# Patient Record
Sex: Male | Born: 1945 | Race: White | Hispanic: No | Marital: Married | Smoking: Former smoker
Health system: Southern US, Community
[De-identification: ages and names within clinical notes are randomized; demographics above are authoritative.]

## PROBLEM LIST (undated history)

## (undated) DIAGNOSIS — C61 Malignant neoplasm of prostate: Secondary | ICD-10-CM

## (undated) DIAGNOSIS — Z77090 Contact with and (suspected) exposure to asbestos: Secondary | ICD-10-CM

## (undated) HISTORY — DX: Malignant neoplasm of prostate: C61

## (undated) HISTORY — DX: Contact with and (suspected) exposure to asbestos: Z77.090

## (undated) HISTORY — PX: LIVER BIOPSY: SHX301

## (undated) HISTORY — PX: HERNIA REPAIR: SHX51

## (undated) HISTORY — PX: HEMORRHOID SURGERY: SHX153

---

## 2010-05-22 HISTORY — PX: ROTATOR CUFF REPAIR: SHX139

## 2010-07-12 ENCOUNTER — Other Ambulatory Visit: Payer: Self-pay | Admitting: Orthopedic Surgery

## 2010-07-12 ENCOUNTER — Encounter (HOSPITAL_COMMUNITY)
Admission: RE | Admit: 2010-07-12 | Discharge: 2010-07-12 | Disposition: A | Discharge status: Home / Self Care | Payer: Worker's Compensation | Source: Ambulatory Visit | Attending: Orthopedic Surgery | Admitting: Orthopedic Surgery

## 2010-07-12 ENCOUNTER — Ambulatory Visit (HOSPITAL_COMMUNITY)
Admission: RE | Admit: 2010-07-12 | Discharge: 2010-07-12 | Disposition: A | Discharge status: Home / Self Care | Payer: Worker's Compensation | Source: Ambulatory Visit | Attending: Orthopedic Surgery | Admitting: Orthopedic Surgery

## 2010-07-12 DIAGNOSIS — Z01811 Encounter for preprocedural respiratory examination: Secondary | ICD-10-CM

## 2010-07-12 DIAGNOSIS — Z01818 Encounter for other preprocedural examination: Secondary | ICD-10-CM | POA: Insufficient documentation

## 2010-07-12 DIAGNOSIS — Z01812 Encounter for preprocedural laboratory examination: Secondary | ICD-10-CM | POA: Insufficient documentation

## 2010-07-12 LAB — CBC
HCT: 46.4 % (ref 39.0–52.0)
Hemoglobin: 16.3 g/dL (ref 13.0–17.0)
MCHC: 35.1 g/dL (ref 30.0–36.0)
RBC: 5.42 MIL/uL (ref 4.22–5.81)

## 2010-07-12 LAB — URINALYSIS, ROUTINE W REFLEX MICROSCOPIC
Nitrite: NEGATIVE
Protein, ur: NEGATIVE mg/dL
Specific Gravity, Urine: 1.017 (ref 1.005–1.030)
Urobilinogen, UA: 1 mg/dL (ref 0.0–1.0)

## 2010-07-12 LAB — PROTIME-INR
INR: 0.92 (ref 0.00–1.49)
Prothrombin Time: 12.6 seconds (ref 11.6–15.2)

## 2010-07-12 LAB — BASIC METABOLIC PANEL
CO2: 28 mEq/L (ref 19–32)
Calcium: 9.8 mg/dL (ref 8.4–10.5)
Creatinine, Ser: 1.36 mg/dL (ref 0.4–1.5)
GFR calc Af Amer: >60 mL/min (ref >60)
GFR calc non Af Amer: 53 mL/min — ABNORMAL LOW (ref >60)
Sodium: 135 mEq/L (ref 135–145)

## 2010-07-12 LAB — SURGICAL PCR SCREEN
MRSA, PCR: NEGATIVE
Staphylococcus aureus: NEGATIVE

## 2010-07-12 LAB — APTT: aPTT: 26 seconds (ref 24–37)

## 2010-07-19 ENCOUNTER — Inpatient Hospital Stay (HOSPITAL_COMMUNITY)
Admission: RE | Admit: 2010-07-19 | Discharge: 2010-07-20 | DRG: 512 | Disposition: A | Discharge status: Home Health | Payer: Worker's Compensation | Source: Ambulatory Visit | Attending: Orthopedic Surgery | Admitting: Orthopedic Surgery

## 2010-07-19 DIAGNOSIS — Y990 Civilian activity done for income or pay: Secondary | ICD-10-CM

## 2010-07-19 DIAGNOSIS — K219 Gastro-esophageal reflux disease without esophagitis: Secondary | ICD-10-CM | POA: Diagnosis present

## 2010-07-19 DIAGNOSIS — I10 Essential (primary) hypertension: Secondary | ICD-10-CM | POA: Diagnosis present

## 2010-07-19 DIAGNOSIS — S43429A Sprain of unspecified rotator cuff capsule, initial encounter: Principal | ICD-10-CM | POA: Diagnosis present

## 2010-07-19 DIAGNOSIS — X58XXXA Exposure to other specified factors, initial encounter: Secondary | ICD-10-CM | POA: Diagnosis present

## 2010-07-20 NOTE — Op Note (Signed)
NAME:  Edward Gilbert, Edward Gilbert               ACCOUNT NO.:  [id number]  MEDICAL RECORD NO.:  [id number]           PATIENT TYPE:  I  LOCATION:  5007                         FACILITY:  [facility]  PHYSICIAN:  Burnard Bunting, M.D.    DATE OF BIRTH:  12/31/1945  DATE OF PROCEDURE:  07/19/2010 DATE OF DISCHARGE:                              OPERATIVE REPORT   PREOPERATIVE DIAGNOSIS:  Right shoulder rotator cuff tear and biceps tendinopathy.  POSTOPERATIVE DIAGNOSIS:  Right shoulder rotator cuff tear and biceps tendinopathy.  PROCEDURE:  Right shoulder diagnostic arthroscopy with extensive debridement of the labrum and torn and degenerated rotator cuff, particularly the infraspinatus with subsequent open biceps tenodesis and open partial rotator cuff tendon repair of the supraspinatus, infraspinatus was not repairable.  INDICATIONS:  Neizan Debruhl is a 65 year old patient with right shoulder pain following a work injury.  MRI scan was consistent with tear of the infraspinatus and supraspinatus with retraction to the glenoid rim. Interestingly, there was no entry in the muscle belly.  The patient presents now for operative management after explanation of risks and benefits.  PROCEDURE IN DETAIL:  The patient was brought to operating room where general endotracheal anesthesia was induced.  Preoperative antibiotics were administered.  The patient was placed in the beach-chair position with the head in neutral position.  Right shoulder, arm, and hand was prescribed with alcohol and Betadine which was allowed to air dry and then prepped with DuraPrep solution, draped in a sterile manner.  Collier Flowers was used to cover the axilla.  Time-out was called.  Posterior portal was created 2 cm medial and inferior to the posterolateral margin of the acromion.  Diagnostic arthroscopy was performed.  Anterior portal was created under direct visualization.  The patient did have some synovitis within the rotator  interval.  This was debrided with the ArthroCare wand.  The biceps tendon had significant adenopathy, greater than 50% tearing, it was released with the ArthroCare wand.  Extensive debridement of the labrum was then performed.  The glenohumeral articular surfaces were intact.  Lateral portal was created and an attempt at mobilization was made.  Using both arthroscopic periosteal elevator, dissection was performed on the superior aspect of the glenoid and on the superior aspect of the rotator cuff in order to mobilize the tendon under traction as it was being held with a grasper.  Tendon would not mobilize, would only mobilize within 2 cm of the medial edge of the footprint.  This was the case for the infraspinatus.  The anterior supraspinatus was able to be mobilized to the tuberosity on the lateral aspect of the bicipital groove.  There was a partial subscap tendinopathy as well.  At this time, the instruments were removed from their portals.  Anterior and posterior portal were closed using 3-0 nylon and Ioban was then used to cover the shoulder.  An incision was made in the midportion of the deltoid.  Skin and subcutaneous tissue were sharply divided.  About 8 mm of the deltoid was detached posteriorly in order to facilitate visualization.  Again, another attempt was made to mobilize the infraspinatus under direct visualization,  but it was not to be.  The tendon was too retracted and too immobile.  The anterior supraspinatus has a sleeve with the subscap was detached and it could be repaired.  At this time, the biceps tendon was identified and the transverse humeral ligament was opened on its medial side.  Biceps tenodesis was then performed with a 7 x 23 bioabsorbable tenodesis screw.  In general, the tendon itself was significantly frayed even down to the portion where it was tenodesed. About a centimeter was resected in order to facilitate optimal tension. At this time, after  tenodesing it with interference screw after placing #2 FiberLoop in place, the rotator cuff was repaired anteriorly.  Using two 5.5 corkscrews and two 4.5 PushLock, a reasonable repair was achieved.  No rotator cuff flaps were remaining.  At this time, joint was thoroughly irrigated.  Instruments were removed.  There was a #1 Vicryl suture which was placed proximally 5 cm from the lateral edge of the acromion to prevent further splitting of the deltoid.  That suture was removed and the deltoid split was repaired using #1 Vicryl suture, one suture through the acromion was placed #2 FiberWire in the inferior deltoid and superior deltoid fascia from that far of the deltoid which was elevated off the acromion was then repaired.  Good secure repair was achieved.  Incision was then closed using interrupted inverted 2-0 Vicryl and running 3-0 pullout Prolene.  The patient tolerated the procedure well without immediate complications.     Burnard Bunting, M.D.     GSD/MEDQ  D:  07/19/2010  T:  07/20/2010  Job:  [id number]  Electronically Signed by Reece Agar.  DEAN M.D. on 07/20/2010 08:43:30 AM

## 2021-09-06 DIAGNOSIS — C220 Liver cell carcinoma: Secondary | ICD-10-CM | POA: Insufficient documentation

## 2021-09-07 ENCOUNTER — Inpatient Hospital Stay (HOSPITAL_COMMUNITY): Payer: No Typology Code available for payment source

## 2021-09-07 ENCOUNTER — Encounter (HOSPITAL_COMMUNITY): Payer: Self-pay | Admitting: Hematology

## 2021-09-07 ENCOUNTER — Inpatient Hospital Stay (HOSPITAL_COMMUNITY): Payer: No Typology Code available for payment source | Attending: Hematology | Admitting: Hematology

## 2021-09-07 DIAGNOSIS — Z8546 Personal history of malignant neoplasm of prostate: Secondary | ICD-10-CM | POA: Diagnosis not present

## 2021-09-07 DIAGNOSIS — M19042 Primary osteoarthritis, left hand: Secondary | ICD-10-CM | POA: Diagnosis not present

## 2021-09-07 DIAGNOSIS — Z800 Family history of malignant neoplasm of digestive organs: Secondary | ICD-10-CM | POA: Insufficient documentation

## 2021-09-07 DIAGNOSIS — Z803 Family history of malignant neoplasm of breast: Secondary | ICD-10-CM | POA: Diagnosis not present

## 2021-09-07 DIAGNOSIS — R634 Abnormal weight loss: Secondary | ICD-10-CM | POA: Diagnosis not present

## 2021-09-07 DIAGNOSIS — Z8673 Personal history of transient ischemic attack (TIA), and cerebral infarction without residual deficits: Secondary | ICD-10-CM | POA: Insufficient documentation

## 2021-09-07 DIAGNOSIS — Z7982 Long term (current) use of aspirin: Secondary | ICD-10-CM | POA: Diagnosis not present

## 2021-09-07 DIAGNOSIS — C220 Liver cell carcinoma: Secondary | ICD-10-CM | POA: Insufficient documentation

## 2021-09-07 DIAGNOSIS — Z87891 Personal history of nicotine dependence: Secondary | ICD-10-CM | POA: Diagnosis not present

## 2021-09-07 LAB — CBC WITH DIFFERENTIAL/PLATELET
Abs Immature Granulocytes: 0.01 10*3/uL (ref 0.00–0.07)
Basophils Absolute: 0.1 10*3/uL (ref 0.0–0.1)
Basophils Relative: 1 %
Eosinophils Absolute: 0.4 10*3/uL (ref 0.0–0.5)
Eosinophils Relative: 7 %
HCT: 41 % (ref 39.0–52.0)
Hemoglobin: 14.3 g/dL (ref 13.0–17.0)
Immature Granulocytes: 0 %
Lymphocytes Relative: 19 %
Lymphs Abs: 1.1 10*3/uL (ref 0.7–4.0)
MCH: 31.2 pg (ref 26.0–34.0)
MCHC: 34.9 g/dL (ref 30.0–36.0)
MCV: 89.5 fL (ref 80.0–100.0)
Monocytes Absolute: 0.6 10*3/uL (ref 0.1–1.0)
Monocytes Relative: 10 %
Neutro Abs: 3.9 10*3/uL (ref 1.7–7.7)
Neutrophils Relative %: 63 %
Platelets: 196 10*3/uL (ref 150–400)
RBC: 4.58 MIL/uL (ref 4.22–5.81)
RDW: 13 % (ref 11.5–15.5)
WBC: 6 10*3/uL (ref 4.0–10.5)
nRBC: 0 % (ref 0.0–0.2)

## 2021-09-07 LAB — HEPATITIS C ANTIBODY: HCV Ab: NONREACTIVE

## 2021-09-07 LAB — COMPREHENSIVE METABOLIC PANEL
ALT: 19 U/L (ref 0–44)
AST: 25 U/L (ref 15–41)
Albumin: 3.7 g/dL (ref 3.5–5.0)
Alkaline Phosphatase: 66 U/L (ref 38–126)
Anion gap: 7 (ref 5–15)
BUN: 17 mg/dL (ref 8–23)
CO2: 28 mmol/L (ref 22–32)
Calcium: 8.9 mg/dL (ref 8.9–10.3)
Chloride: 105 mmol/L (ref 98–111)
Creatinine, Ser: 0.8 mg/dL (ref 0.61–1.24)
GFR, Estimated: >60 mL/min (ref >60)
Glucose, Bld: 101 mg/dL — ABNORMAL HIGH (ref 70–99)
Potassium: 3.5 mmol/L (ref 3.5–5.1)
Sodium: 140 mmol/L (ref 135–145)
Total Bilirubin: 0.6 mg/dL (ref 0.3–1.2)
Total Protein: 7 g/dL (ref 6.5–8.1)

## 2021-09-07 LAB — HEPATITIS B CORE ANTIBODY, TOTAL: Hep B Core Total Ab: NONREACTIVE

## 2021-09-07 LAB — PROTIME-INR
INR: 1 (ref 0.8–1.2)
Prothrombin Time: 13.5 seconds (ref 11.4–15.2)

## 2021-09-07 LAB — APTT: aPTT: 28 seconds (ref 24–36)

## 2021-09-07 LAB — HEPATITIS B SURFACE ANTIBODY,QUALITATIVE: Hep B S Ab: NONREACTIVE

## 2021-09-07 LAB — HEPATITIS B SURFACE ANTIGEN: Hepatitis B Surface Ag: NONREACTIVE

## 2021-09-07 NOTE — Progress Notes (Signed)
? ?[facility] ?[address] ?[place], [place] [address] ? ? ?CLINIC:  ?Medical Oncology/Hematology ? ?CONSULT NOTE ? ?Patient Care Team: ?Derek Jack, MD as Medical Oncologist (Medical Oncology) ?Brien Mates, RN as Oncology Nurse Navigator (Medical Oncology) ? ?CHIEF COMPLAINTS/PURPOSE OF CONSULTATION:  ?Evaluation of liver cancer  ? ?HISTORY OF PRESENTING ILLNESS:  ?[name] 76 y.o. male is here because of evaluation of liver cancer, at the request of [place]. ? ?Today he reports feeling good. He has a history of prostate cancer treated with 20 radiation treatments in 2019. He was under active surveillance with labs done every 6 months. He reports a liver mass was found incidentally on a CT following up an aortic aneurysm. He reports he has lost 40 lbs over the past year. He denies history of blood transfusions. He denies history of liver problems. He denies current pain. He reports easy bleeding when scratches, but he otherwise denies current bleeding issues. He takes one 81 mg aspirin at night. 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 He denies leg swellings. He denies abdominal pain.  ? ?He currently lives with his wife at home. He is able to do is daily activities at home including yard work. 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 He denies agent orange exposure and chemical exposure. He quit smoking cigarettes 10 years ago after smoking 25 years; he currently occasionally smokes a pipe and drinks occasionally. His brother passed away from pancreatic cancer, and his sister had breast cancer.  ? ?MEDICAL HISTORY:  ?Past Medical History:  ?Diagnosis Date  ? Asbestos exposure   ? Prostate cancer ([place])   ? ? ?SURGICAL HISTORY: ?Past Surgical History:  ?Procedure Laterality Date  ? HEMORRHOID SURGERY    ? HERNIA REPAIR    ? LIVER BIOPSY    ? ROTATOR CUFF REPAIR Right 2012  ? ? ?SOCIAL  HISTORY: ?Social History  ? ?Socioeconomic History  ? Marital status: Married  ?  Spouse name: Not on file  ? Number of children: Not on file  ? Years of education: Not on file  ? Highest education level: Not on file  ?Occupational History  ? Not on file  ?Tobacco Use  ? Smoking status: Former  ?  Packs/day: 0.50  ?  Years: 25.00  ?  Pack years: 12.50  ?  Types: Cigarettes  ?  Quit date: 2013  ?  Years since quitting: 10.3  ? Smokeless tobacco: Never  ?Vaping Use  ? Vaping Use: Never used  ?Substance and Sexual Activity  ? Alcohol use: Yes  ?  Comment: occasionally  ? Drug use: Never  ? Sexual activity: Not Currently  ?Other Topics Concern  ? Not on file  ?Social History Narrative  ? Not on file  ? ?Social Determinants of Health  ? ?Financial Resource Strain: Not on file  ?Food Insecurity: Not on file  ?Transportation Needs: Not on file  ?Physical Activity: Not on file  ?Stress: Not on file  ?Social Connections: Not on file  ?Intimate Partner Violence: Not on file  ? ? ?FAMILY HISTORY: ?Family History  ?Problem Relation Age of Onset  ? Pancreatic cancer Brother   ? ? ?ALLERGIES:  ?has no allergies on file. ? ?MEDICATIONS:  ?Current Outpatient Medications  ?Medication Sig Dispense Refill  ? amLODipine (NORVASC) 5 MG tablet Take 5 mg by mouth daily.    ?  ASPIRIN 81 PO Take by mouth.    ? Cholecalciferol (VITAMIN D3) 10 MCG (400 UNIT) tablet Take 400 Units by mouth daily.    ? lisinopril (ZESTRIL) 20 MG tablet Take 1 tablet by mouth daily.    ? omeprazole (PRILOSEC) 20 MG capsule Take 1 capsule by mouth.    ? TAMSULOSIN HCL PO Take 1 tablet by mouth daily.    ? ?No current facility-administered medications for this visit.  ? ? ?REVIEW OF SYSTEMS:   ?Review of Systems  ?Constitutional:  Positive for unexpected weight change (-40 lbs). Negative for appetite change and fatigue.  ?HENT:   Negative for nosebleeds.   ?Respiratory:  Positive for cough. Negative for hemoptysis.   ?Cardiovascular:  Negative for leg swelling.   ?Gastrointestinal:  Positive for constipation and diarrhea. Negative for abdominal pain and blood in stool.  ?Genitourinary:  Positive for dysuria. Negative for hematuria.   ?Neurological:  Positive for numbness (R hand - carpal tunnel).  ?Hematological:  Bruises/bleeds easily.  ?Psychiatric/Behavioral:  Positive for sleep disturbance.   ?All other systems reviewed and are negative. ? ? ?PHYSICAL EXAMINATION: ?ECOG PERFORMANCE STATUS: 1 - Symptomatic but completely ambulatory ? ?Vitals:  ? 09/07/21 0841  ?BP: 133/63  ?Pulse: (!) 102  ?Resp: 16  ?Temp: 97.7 ?F (36.5 ?C)  ?SpO2: 95%  ? ?Filed Weights  ? 09/07/21 0841  ?Weight: 167 lb 8 oz (76 kg)  ? ?Physical Exam ?Vitals reviewed.  ?Constitutional:   ?   Appearance: Normal appearance.  ?Cardiovascular:  ?   Rate and Rhythm: Normal rate and regular rhythm.  ?   Pulses: Normal pulses.  ?   Heart sounds: Normal heart sounds.  ?Pulmonary:  ?   Effort: Pulmonary effort is normal.  ?   Breath sounds: Normal breath sounds.  ?Abdominal:  ?   Palpations: Abdomen is soft. There is hepatomegaly (2-3 fingerbreadths below costal margin). There is no splenomegaly or mass.  ?   Tenderness: There is no abdominal tenderness.  ?Musculoskeletal:  ?   Right lower leg: No edema.  ?   Left lower leg: No edema.  ?Lymphadenopathy:  ?   Cervical: No cervical adenopathy.  ?   Right cervical: No superficial cervical adenopathy. ?   Left cervical: No superficial cervical adenopathy.  ?   Upper Body:  ?   Right upper body: No supraclavicular or axillary adenopathy.  ?   Left upper body: No supraclavicular or axillary adenopathy.  ?   Lower Body: No right inguinal adenopathy. No left inguinal adenopathy.  ?Neurological:  ?   General: No focal deficit present.  ?   Mental Status: He is alert and oriented to person, place, and time.  ?Psychiatric:     ?   Mood and Affect: Mood normal.     ?   Behavior: Behavior normal.  ? ? ? ?LABORATORY DATA:  ?I have reviewed the data as listed ? ?  Latest Ref  Rng & Units 07/12/2010  ? 10:38 AM  ?CBC  ?WBC 4.0 - 10.5 K/uL 7.0    ?Hemoglobin 13.0 - 17.0 g/dL 16.3    ?Hematocrit 39.0 - 52.0 % 46.4    ?Platelets 150 - 400 K/uL 178    ? ? ?  Latest Ref Rng & Units 07/12/2010  ? 10:38 AM  ?CMP  ?Glucose 70 - 99 mg/dL 174    ?BUN 6 - 23 mg/dL 22    ?Creatinine 0.4 - 1.5 mg/dL 1.36    ?Sodium 135 -  145 mEq/L 135    ?Potassium 3.5 - 5.1 mEq/L 4.4    ?Chloride 96 - 112 mEq/L 99    ?CO2 19 - 32 mEq/L 28    ?Calcium 8.4 - 10.5 mg/dL 9.8    ? ? ?RADIOGRAPHIC STUDIES: ?I have personally reviewed the radiological images as listed and agreed with the findings in the report. ?No results found. ? 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  Superimposed on chronic fatty infiltration of the liver. ?- PET scan (07/25/2021): Mild avidity in the 10.5 cm right lobe liver lesion.  No evidence of regional or distant metastatic disease. ?- Brain MRI (07/26/2021): No intracranial metastatic disease. ?- Biopsy (08/09/2021): Well-differentiated HCC ? ? ?Social/family history: ?- He lives at home with his wife.  He has 2 grown sons.  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  Denied exposure to agent orange.  He was also stationed at the [employer] in [place].  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  Quit smoking cigarettes 10 years ago.  He smokes pipe occasionally.  Occasional alcohol. ?- Brother died of pancreatic cancer.  Sister had breast cancer. ? ?3.  Prostate cancer: ?- T1c, Gleason 3+4=7, PSA 6.2 ?- Radiation therapy completed in [place] on 02/25/2018 ? ? 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 ?- We have discussed options for further management.  Based on Milan criteria, not a candidate for for transplant given the size more than  5 cm. ?- Recommend MRI of the liver with and without contrast to see if portal vein tumor thrombosis present. ?- We will present at the GI tumor board and see if he is a candidate for surgery/TACE/TACE/systemic the

## 2021-09-07 NOTE — Patient Instructions (Addendum)
[place] at [facility] ?Discharge Instructions ? ?You were seen and examined today by Dr. Delton Coombes. Dr. Delton Coombes is a medical oncologist, meaning that he specializes in the treatment of cancer diagnoses. Dr. Delton Coombes discussed your past medical history, family history of cancers, and the events that led to you being here today. ? ?You were referred to Dr. Delton Coombes by the [facility] due to your diagnosis of hepatocellular carcinoma. Your recent PET scan did not show any metastasis to anywhere else within your body.  ? ?Dr. Delton Coombes has recommended additional lab work today as well as a liver MRI to complete the work-up and determine if it is a Stage II or Stage III. ? ?Follow-up as scheduled with Dr. Delton Coombes after MRI. ? ? ?Thank you for choosing [place] at [facility] to provide your oncology and hematology care.  To afford each patient quality time with our provider, please arrive at least 15 minutes before your scheduled appointment time.  ? ?If you have a lab appointment with the [facility] please come in thru the Main Entrance and check in at the main information desk. ? ?You need to re-schedule your appointment should you arrive 10 or more minutes late.  We strive to give you quality time with our providers, and arriving late affects you and other patients whose appointments are after yours.  Also, if you no show three or more times for appointments you may be dismissed from the clinic at the providers discretion.     ?Again, thank you for choosing [facility].  Our hope is that these requests will decrease the amount of time that you wait before being seen by our physicians.       ?_____________________________________________________________ ? ?Should you have questions after your visit to [facility], please contact our office at [phone] and follow the prompts.  Our office hours are 8:00 a.m. and 4:30 p.m. Monday -  Friday.  Please note that voicemails left after 4:00 p.m. may not be returned until the following business day.  We are closed weekends and major holidays.  You do have access to a nurse 24-7, just call the main number to the clinic [phone] and do not press any options, hold on the line and a nurse will answer the phone.   ? ?For prescription refill requests, have your pharmacy contact our office and allow 72 hours.   ? ?Due to Covid, you will need to wear a mask upon entering the hospital. If you do not have a mask, a mask will be given to you at the Main Entrance upon arrival. For doctor visits, patients may have 1 support person age 30 or older with them. For treatment visits, patients can not have anyone with them due to social distancing guidelines and our immunocompromised population.  ? ? ? ?

## 2021-09-08 ENCOUNTER — Encounter (HOSPITAL_COMMUNITY): Payer: Self-pay

## 2021-09-08 LAB — AFP TUMOR MARKER: AFP, Serum, Tumor Marker: 4.9 ng/mL (ref 0.0–8.4)

## 2021-09-08 NOTE — Progress Notes (Signed)
I met with the patient during and following initial visit with Dr. Delton Coombes. I explained my role in the patient's care. I provided my contact information and encouraged the patient to call with questions or concerns. ?

## 2021-09-16 ENCOUNTER — Ambulatory Visit (HOSPITAL_COMMUNITY)
Admission: RE | Admit: 2021-09-16 | Discharge: 2021-09-16 | Disposition: A | Discharge status: Home / Self Care | Payer: No Typology Code available for payment source | Source: Ambulatory Visit | Attending: Hematology | Admitting: Hematology

## 2021-09-16 DIAGNOSIS — C220 Liver cell carcinoma: Secondary | ICD-10-CM | POA: Diagnosis present

## 2021-09-16 IMAGING — MR MR ABDOMEN WO/W CM
19 of 20 series · 45 of 48 positions shown · IV contrast (8 ml Gadavist)
Comparison: None.
COMPARISON: None.

Addendum:
CLINICAL DATA: Hepatocellular carcinoma/gallbladder/biliary cancer.
Right-sided abdominal pain for few days. Restaging.

EXAM:
MRI ABDOMEN WITHOUT AND WITH CONTRAST
TECHNIQUE: Multiplanar multisequence MR imaging of the abdomen was performed
both before and after the administration of intravenous contrast.
CONTRAST:  8mL GADAVIST GADOBUTROL 1 MMOL/ML IV SOLN

[Series 8: T2 fat-sat · axial · 6.0mm · 1.19mm/px · z∈[-143,+102]mm · 2 of 35 slices shown]
[im 1/35]
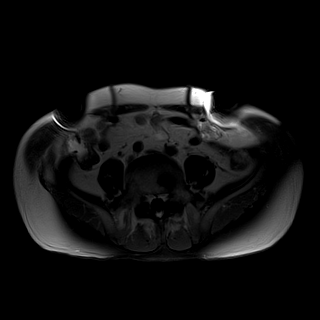
[im 35/35]
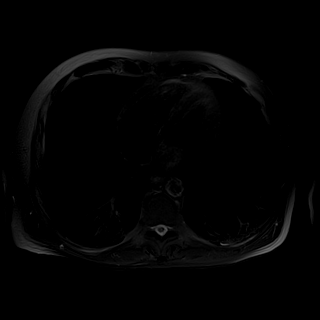

[Series 9: DWI · axial · 6.0mm · 1.42mm/px · z∈[-139,+98]mm · 2 of 34 slices shown (1 of 4)]
[im 1/34]
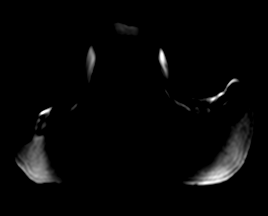
[im 34/34]
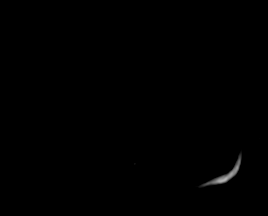

[Series 9: DWI · axial · 6.0mm · 1.42mm/px · 1 of 34 slices shown (2 of 4)]
[im 1/34]
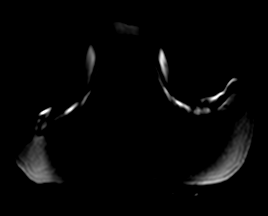

[Series 9: DWI · axial · 6.0mm · 1.42mm/px · 1 of 34 slices shown (3 of 4)]
[im 1/34]
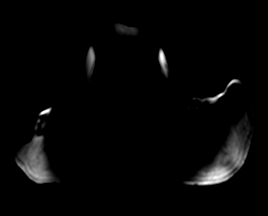

[Series 10: DWI · axial · 6.0mm · 1.42mm/px · 1 of 34 slices shown (4 of 4)]
[im 1/34]
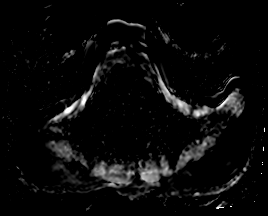

[Series 11: ax haste bh · axial · 6.0mm · 1.19mm/px · z∈[-170,+110]mm · 2 of 40 slices shown]
[im 1/40]
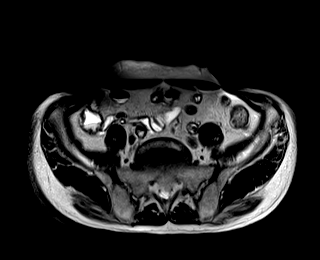
[im 40/40]
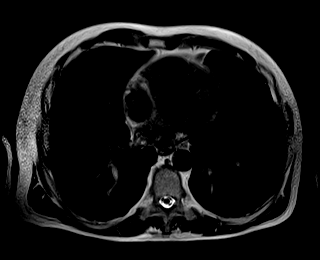

[Series 12: ax in and · axial · 3.0mm · 1.19mm/px · z∈[-118,+95]mm · 3 of 72 slices shown (1 of 2)]
[im 1/72]
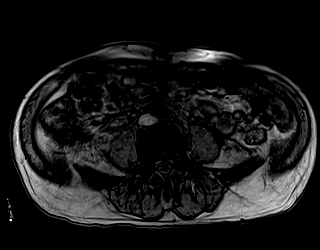
[im 36/72]
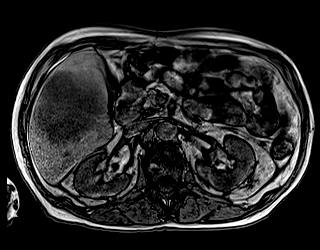
[im 72/72]
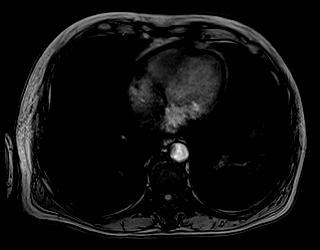

[Series 13: ax in and · axial · 3.0mm · 1.19mm/px · z∈[-118,+95]mm · 3 of 72 slices shown (2 of 2)]
[im 1/72]
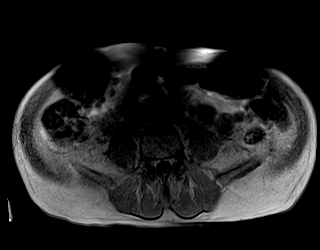
[im 36/72]
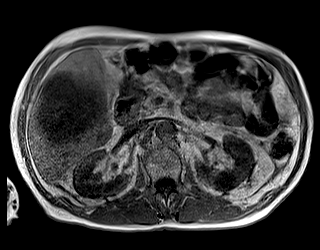
[im 72/72]
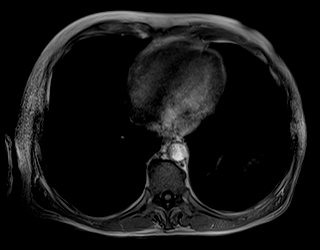

[Series 14: cor haste · coronal · 6.0mm · 1.25mm/px · 1 of 34 slices shown]
[im 1/34]
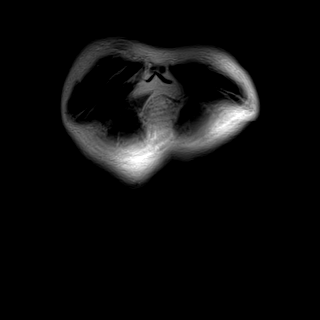

[Series 15: bSSFP · axial · 6.0mm · 0.76mm/px · z∈[-131,+103]mm · 2 of 40 slices shown]
[im 1/40]
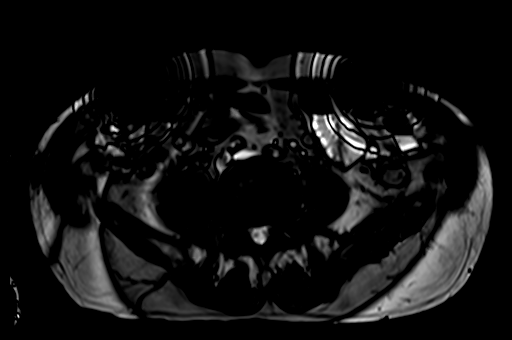
[im 40/40]
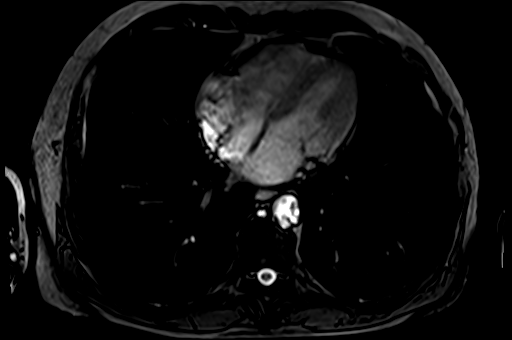

[Series 16: T1 dynamic · axial · 3.0mm · 1.19mm/px · z∈[-111,+102]mm · 3 of 72 slices shown]
[im 1/72]
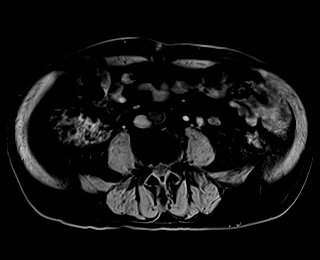
[im 36/72]
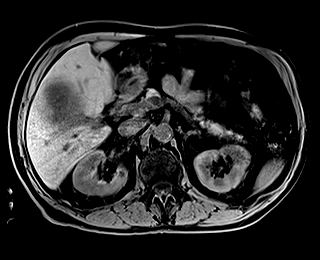
[im 72/72]
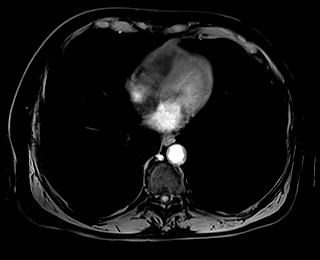

[Series 17: T1 dynamic post-contrast · axial · 3.0mm · 1.19mm/px · z∈[-111,+102]mm · 3 of 72 slices shown (1 of 8)]
[im 1/72]
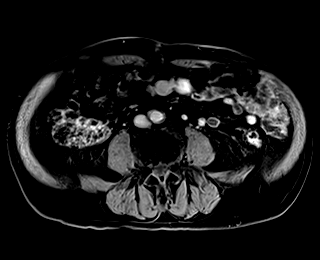
[im 36/72]
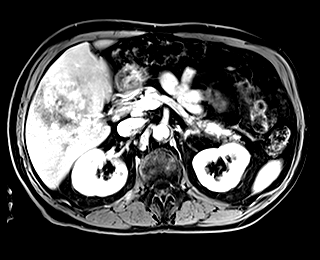
[im 72/72]
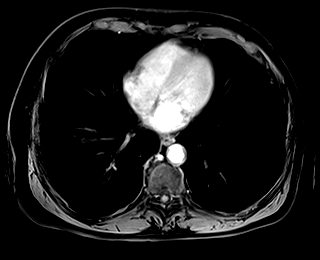

[Series 18: T1 dynamic post-contrast · axial · 3.0mm · 1.19mm/px · z∈[-111,+102]mm · 3 of 72 slices shown (2 of 8)]
[im 1/72]
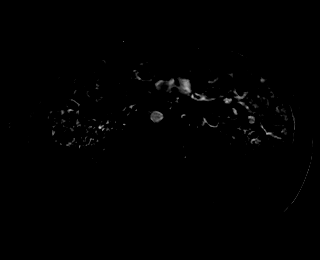
[im 36/72]
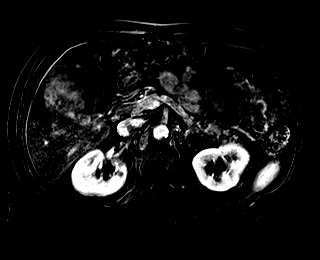
[im 72/72]
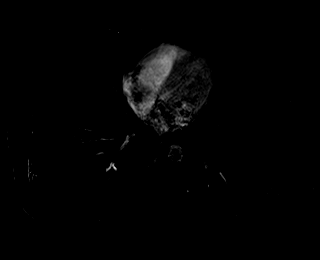

[Series 19: T1 dynamic post-contrast · axial · 3.0mm · 1.19mm/px · z∈[-111,+102]mm · 3 of 72 slices shown (3 of 8)]
[im 1/72]
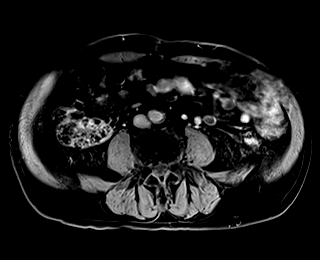
[im 36/72]
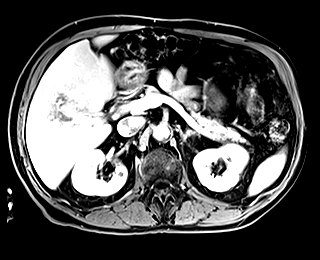
[im 72/72]
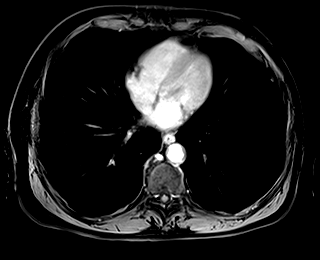

[Series 20: T1 dynamic post-contrast · axial · 3.0mm · 1.19mm/px · z∈[-111,+102]mm · 3 of 72 slices shown (4 of 8)]
[im 1/72]
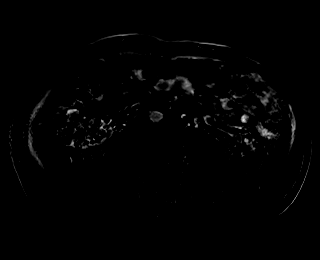
[im 36/72]
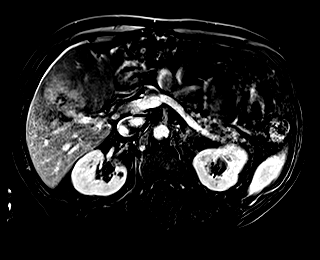
[im 72/72]
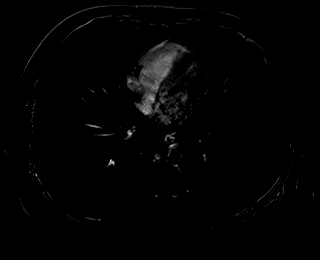

[Series 21: T1 dynamic post-contrast · axial · 3.0mm · 1.19mm/px · z∈[-111,+102]mm · 3 of 72 slices shown (5 of 8)]
[im 1/72]
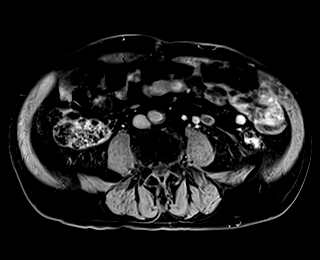
[im 36/72]
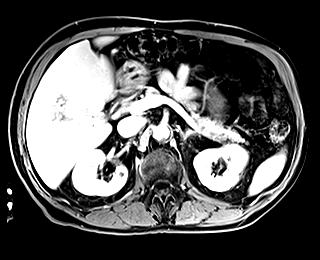
[im 72/72]
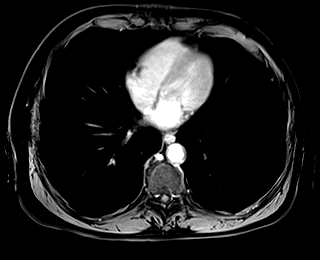

[Series 22: T1 dynamic post-contrast · axial · 3.0mm · 1.19mm/px · z∈[-111,+102]mm · 3 of 72 slices shown (6 of 8)]
[im 1/72]
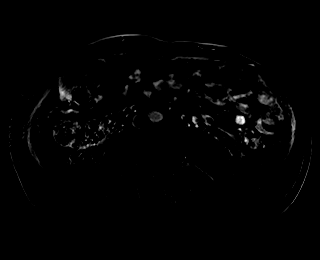
[im 36/72]
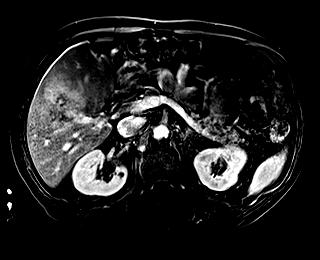
[im 72/72]
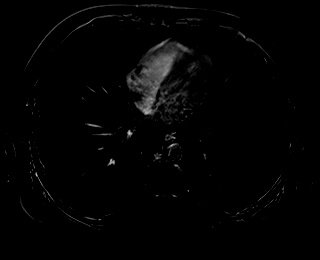

[Series 23: T1 dynamic post-contrast · coronal · 3.0mm · 1.31mm/px · 3 of 80 slices shown (7 of 8)]
[im 1/80]
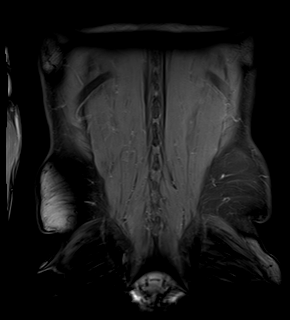
[im 40/80]
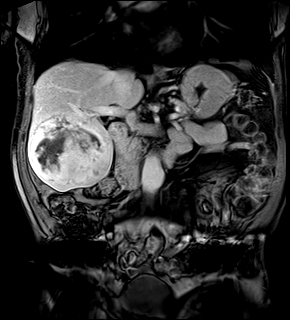
[im 80/80]
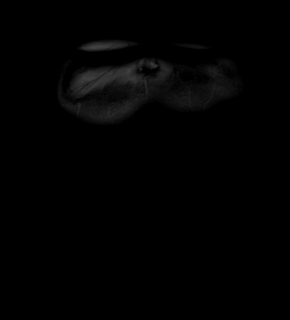

[Series 24: T1 dynamic post-contrast · axial · 3.0mm · 1.19mm/px · z∈[-111,+102]mm · 3 of 72 slices shown (8 of 8)]
[im 1/72]
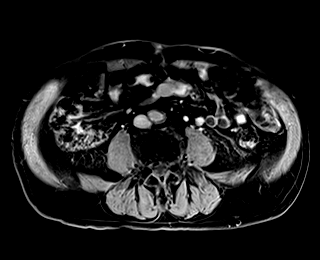
[im 36/72]
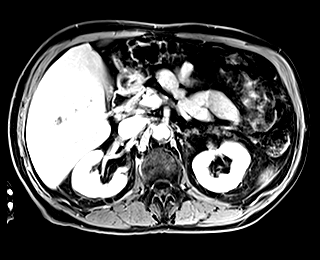
[im 72/72]
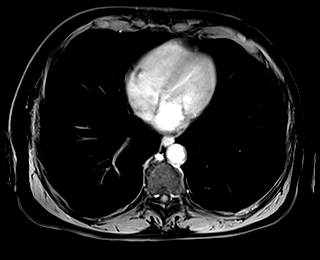

[45 of 48 positions shown; findings below may reference images not displayed]

The patient's outside CT and report are not
available, per discussion with the clinical service.
FINDINGS: Lower chest: Normal heart size without pericardial or pleural
effusion.

Hepatobiliary: No cirrhosis. Dominant right hepatic lobe mass
centered in segments 5 and 6. Measures 11.0 x 10.4 x 9.1 cm on [DATE]
and 16/14. Demonstrates heterogeneous moderate to marked T2
hyperintensity with mild areas of precontrast T1 hyperintensity,
including on 47/16. After contrast, demonstrates peripheral
irregular early and delayed enhancement. No surrounding edema,
biliary duct dilatation, or venous occlusion. Normal gallbladder,
without biliary ductal dilatation.

Pancreas:  Normal, without mass or ductal dilatation.

Spleen: Linear hypointensity about the medial spleen may represent
calcification including on 14/21. No splenomegaly.

Adrenals/Urinary Tract: Normal adrenal glands. Bilateral too small
to characterize renal lesions. Posterior interpolar right renal
cm simple cyst. No hydronephrosis.

Stomach/Bowel: The proximal stomach is underdistended, but the
greater curvature appears thick walled 3.6 cm on 24/19. Normal
abdominal bowel loops.

Vascular/Lymphatic: Aortic atherosclerosis. Infrarenal aortic
dilatation at 3.1 cm on 61/17. Patent portal, splenic, hepatic
veins. No retroperitoneal or retrocrural adenopathy.

Other:  No ascites.

Musculoskeletal: No acute osseous abnormality.
IMPRESSION: 1. Dominant right hepatic lobe mass has nonspecific imaging
characteristics. Although hepatocellular carcinoma or
cholangiocarcinoma could have this appearance, an atypical
(precontrast T1 hyperintensity) appearance of a giant hemangioma
could look similar. If not already performed, consider tissue
sampling.
2. Apparent gastric wall thickening, at least partially felt to be
due to underdistention. Correlate with symptoms of gastritis or
gastric neoplasm. Consider endoscopy.
3.  Aortic Atherosclerosis ([CU]-[CU]).
4. Infrarenal aortic dilatation at 3.1 cm. Recommend follow-up
ultrasound every 3 years. This recommendation follows ACR consensus
guidelines: White Paper of the ACR Incidental Findings Committee II

ADDENDUM:
The clinical service requested estimation of liver volumes.

The right liver lobe is estimated at 11.0 x 12.1 by 16.8 cm. (Volume
= [CU] cm^3)

The left liver lobe is estimated at 12.0 x 8.4 by 6.5 cm. (Volume =
340 cm^3)

*** End of Addendum ***
The patient's outside CT and report are not
available, per discussion with the clinical service.
FINDINGS: Lower chest: Normal heart size without pericardial or pleural
effusion.

Hepatobiliary: No cirrhosis. Dominant right hepatic lobe mass
centered in segments 5 and 6. Measures 11.0 x 10.4 x 9.1 cm on [DATE]
and 16/14. Demonstrates heterogeneous moderate to marked T2
hyperintensity with mild areas of precontrast T1 hyperintensity,
including on 47/16. After contrast, demonstrates peripheral
irregular early and delayed enhancement. No surrounding edema,
biliary duct dilatation, or venous occlusion. Normal gallbladder,
without biliary ductal dilatation.

Pancreas:  Normal, without mass or ductal dilatation.

Spleen: Linear hypointensity about the medial spleen may represent
calcification including on 14/21. No splenomegaly.

Adrenals/Urinary Tract: Normal adrenal glands. Bilateral too small
to characterize renal lesions. Posterior interpolar right renal
cm simple cyst. No hydronephrosis.

Stomach/Bowel: The proximal stomach is underdistended, but the
greater curvature appears thick walled 3.6 cm on 24/19. Normal
abdominal bowel loops.

Vascular/Lymphatic: Aortic atherosclerosis. Infrarenal aortic
dilatation at 3.1 cm on 61/17. Patent portal, splenic, hepatic
veins. No retroperitoneal or retrocrural adenopathy.

Other:  No ascites.

Musculoskeletal: No acute osseous abnormality.
IMPRESSION: 1. Dominant right hepatic lobe mass has nonspecific imaging
characteristics. Although hepatocellular carcinoma or
cholangiocarcinoma could have this appearance, an atypical
(precontrast T1 hyperintensity) appearance of a giant hemangioma
could look similar. If not already performed, consider tissue
sampling.
2. Apparent gastric wall thickening, at least partially felt to be
due to underdistention. Correlate with symptoms of gastritis or
gastric neoplasm. Consider endoscopy.
3.  Aortic Atherosclerosis ([CU]-[CU]).
4. Infrarenal aortic dilatation at 3.1 cm. Recommend follow-up
ultrasound every 3 years. This recommendation follows ACR consensus
guidelines: White Paper of the ACR Incidental Findings Committee II

## 2021-09-16 MED ORDER — GADOBUTROL 1 MMOL/ML IV SOLN
8.0000 mL | Freq: Once | INTRAVENOUS | Status: AC | PRN
  Administered 2021-09-16: 8 mL via INTRAVENOUS

## 2021-09-19 ENCOUNTER — Telehealth (HOSPITAL_COMMUNITY): Payer: Self-pay | Admitting: *Deleted

## 2021-09-19 NOTE — Telephone Encounter (Signed)
Patient called concerned that his MRI has not been read from last week due to the request by the reading radiologist to have available to him previous CT images for comparison.  Unfortunately, these images were obtained at the [place] in [facility] and are not available to [place] at this time.  Patient will keep follow up appointment and per [facility] Radiology exam will be read today.  In the meantime, images have been requested from the [place] for further review. ?

## 2021-09-21 ENCOUNTER — Other Ambulatory Visit: Payer: Self-pay

## 2021-09-21 ENCOUNTER — Inpatient Hospital Stay (HOSPITAL_COMMUNITY): Payer: No Typology Code available for payment source | Attending: Hematology | Admitting: Hematology

## 2021-09-21 VITALS — BP 135/66 | HR 92 | Temp 98.0°F | Resp 18 | Ht 71.0 in | Wt 166.5 lb

## 2021-09-21 DIAGNOSIS — Z8546 Personal history of malignant neoplasm of prostate: Secondary | ICD-10-CM | POA: Insufficient documentation

## 2021-09-21 DIAGNOSIS — Z803 Family history of malignant neoplasm of breast: Secondary | ICD-10-CM | POA: Diagnosis not present

## 2021-09-21 DIAGNOSIS — C220 Liver cell carcinoma: Secondary | ICD-10-CM | POA: Diagnosis present

## 2021-09-21 DIAGNOSIS — Z87891 Personal history of nicotine dependence: Secondary | ICD-10-CM | POA: Diagnosis not present

## 2021-09-21 DIAGNOSIS — Z800 Family history of malignant neoplasm of digestive organs: Secondary | ICD-10-CM | POA: Diagnosis not present

## 2021-09-21 DIAGNOSIS — Z79899 Other long term (current) drug therapy: Secondary | ICD-10-CM | POA: Insufficient documentation

## 2021-09-21 NOTE — Progress Notes (Signed)
? ?[facility] ?[address] ?[place], [place] [address] ? ? ?CLINIC:  ?Medical Oncology/Hematology ? ?PCP:  ?Meltontate, Gillis Santa, MD ?[address] / [place] [address] ?[phone] ? ? 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 ?PRIOR THERAPY: none ? ?NGS Results: not done ? ?CURRENT THERAPY: under work-up ? ?BRIEF ONCOLOGIC HISTORY:  ?Oncology History  ? No history exists.  ? ? ?CANCER STAGING: ?Cancer Staging  ?Hepatocellular carcinoma ([place]) ?Staging form: Liver, AJCC 8th Edition ?- Clinical stage from 09/06/2021: Stage IIIA (cT3, cN0, cM0) - Unsigned ? ? ?INTERVAL HISTORY:  ?Edward Gilbert, a 76 y.o. 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 Edward Gilbert was last seen on 09/07/2021.  ? ?Today he reports feeling good.  ? ?REVIEW OF SYSTEMS:  ?Review of Systems  ?Constitutional:  Negative for appetite change and fatigue.  ?Respiratory:  Positive for cough and shortness of breath.   ?Gastrointestinal:  Positive for constipation and diarrhea.  ?Neurological:  Positive for numbness.  ?Psychiatric/Behavioral:  Positive for sleep disturbance.   ?All other systems reviewed and are negative. ? ?PAST MEDICAL/SURGICAL HISTORY:  ?Past Medical History:  ?Diagnosis Date  ? Asbestos exposure   ? Prostate cancer ([place])   ? ?Past Surgical History:  ?Procedure Laterality Date  ? HEMORRHOID SURGERY    ? HERNIA REPAIR    ? LIVER BIOPSY    ? ROTATOR CUFF REPAIR Right 2012  ? ? ?SOCIAL HISTORY:  ?Social History  ? ?Socioeconomic History  ? Marital status: Married  ?  Spouse name: Not on file  ? Number of children: Not on file  ? Years of education: Not on file  ? Highest education level: Not on file  ?Occupational History  ? Not on file  ?Tobacco Use  ? Smoking status: Former  ?  Packs/day: 0.50  ?  Years: 25.00  ?  Pack years: 12.50  ?  Types: Cigarettes  ?  Quit date: 2013  ?  Years since quitting: 10.3  ? Smokeless tobacco: Never   ?Vaping Use  ? Vaping Use: Never used  ?Substance and Sexual Activity  ? Alcohol use: Yes  ?  Comment: occasionally  ? Drug use: Never  ? Sexual activity: Not Currently  ?Other Topics Concern  ? Not on file  ?Social History Narrative  ? Not on file  ? ?Social Determinants of Health  ? ?Financial Resource Strain: Not on file  ?Food Insecurity: Not on file  ?Transportation Needs: Not on file  ?Physical Activity: Not on file  ?Stress: Not on file  ?Social Connections: Not on file  ?Intimate Partner Violence: Not on file  ? ? ?FAMILY HISTORY:  ?Family History  ?Problem Relation Age of Onset  ? Pancreatic cancer Brother   ? ? ?CURRENT MEDICATIONS:  ?Current Outpatient Medications  ?Medication Sig Dispense Refill  ? amLODipine (NORVASC) 5 MG tablet Take 5 mg by mouth daily.    ? ASPIRIN 81 PO Take by mouth.    ? Cholecalciferol (VITAMIN D3) 10 MCG (400 UNIT) tablet Take 400 Units by mouth daily.    ? lisinopril (ZESTRIL) 20 MG tablet Take 1 tablet by mouth daily.    ? omeprazole (PRILOSEC) 20 MG capsule Take 1 capsule by mouth.    ? TAMSULOSIN HCL PO Take 1 tablet by mouth daily.    ? ?No current facility-administered medications for this visit.  ? ? ?ALLERGIES:  ?Not  on File ? ?PHYSICAL EXAM:  ?Performance status (ECOG): 1 - Symptomatic but completely ambulatory ? ?Vitals:  ? 09/21/21 0838  ?BP: 135/66  ?Pulse: 92  ?Resp: 18  ?Temp: 98 ?F (36.7 ?C)  ?SpO2: 95%  ? ?Wt Readings from Last 3 Encounters:  ?09/21/21 166 lb 8 oz (75.5 kg)  ?09/07/21 167 lb 8 oz (76 kg)  ? ?Physical Exam ?Vitals reviewed.  ?Constitutional:   ?   Appearance: Normal appearance.  ?Cardiovascular:  ?   Rate and Rhythm: Normal rate and regular rhythm.  ?   Pulses: Normal pulses.  ?   Heart sounds: Normal heart sounds.  ?Pulmonary:  ?   Effort: Pulmonary effort is normal.  ?   Breath sounds: Normal breath sounds.  ?Neurological:  ?   General: No focal deficit present.  ?   Mental Status: He is alert and oriented to person, place, and time.   ?Psychiatric:     ?   Mood and Affect: Mood normal.     ?   Behavior: Behavior normal.  ?  ? ?LABORATORY DATA:  ?I have reviewed the labs as listed.  ? ?  Latest Ref Rng & Units 09/07/2021  ?  9:46 AM 07/12/2010  ? 10:38 AM  ?CBC  ?WBC 4.0 - 10.5 K/uL 6.0   7.0    ?Hemoglobin 13.0 - 17.0 g/dL 14.3   16.3    ?Hematocrit 39.0 - 52.0 % 41.0   46.4    ?Platelets 150 - 400 K/uL 196   178    ? ? ?  Latest Ref Rng & Units 09/07/2021  ?  9:46 AM 07/12/2010  ? 10:38 AM  ?CMP  ?Glucose 70 - 99 mg/dL 101   174    ?BUN 8 - 23 mg/dL 17   22    ?Creatinine 0.61 - 1.24 mg/dL 0.80   1.36    ?Sodium 135 - 145 mmol/L 140   135    ?Potassium 3.5 - 5.1 mmol/L 3.5   4.4    ?Chloride 98 - 111 mmol/L 105   99    ?CO2 22 - 32 mmol/L 28   28    ?Calcium 8.9 - 10.3 mg/dL 8.9   9.8    ?Total Protein 6.5 - 8.1 g/dL 7.0     ?Total Bilirubin 0.3 - 1.2 mg/dL 0.6     ?Alkaline Phos 38 - 126 U/L 66     ?AST 15 - 41 U/L 25     ?ALT 0 - 44 U/L 19     ? ? ?DIAGNOSTIC IMAGING:  ?I have independently reviewed the scans and discussed with the patient. ?MR LIVER W WO CONTRAST ? ?Result Date: 09/19/2021 ?CLINICAL DATA:  Hepatocellular carcinoma/gallbladder/biliary cancer. Right-sided abdominal pain for few days. Restaging. EXAM: MRI ABDOMEN WITHOUT AND WITH CONTRAST TECHNIQUE: Multiplanar multisequence MR imaging of the abdomen was performed both before and after the administration of intravenous contrast. CONTRAST:  75m GADAVIST GADOBUTROL 1 MMOL/ML IV SOLN COMPARISON:  None. The patient's outside CT and report are not available, per discussion with the clinical service. FINDINGS: Lower chest: Normal heart size without pericardial or pleural effusion. Hepatobiliary: No cirrhosis. Dominant right hepatic lobe mass centered in segments 5 and 6. Measures 11.0 x 10.4 x 9.1 cm on 21/8 and 16/14. Demonstrates heterogeneous moderate to marked T2 hyperintensity with mild areas of precontrast T1 hyperintensity, including on 47/16. After contrast, demonstrates peripheral  irregular early and delayed enhancement. No surrounding edema, biliary duct dilatation, or venous  occlusion. Normal gallbladder, without biliary ductal dilatation. Pancreas:  Normal, without mass or ductal dilatation. Spleen: Linear hypointensity about the medial spleen may represent calcification including on 14/21. No splenomegaly. Adrenals/Urinary Tract: Normal adrenal glands. Bilateral too small to characterize renal lesions. Posterior interpolar right renal 2.0 cm simple cyst. No hydronephrosis. Stomach/Bowel: The proximal stomach is underdistended, but the greater curvature appears thick walled 3.6 cm on 24/19. Normal abdominal bowel loops. Vascular/Lymphatic: Aortic atherosclerosis. Infrarenal aortic dilatation at 3.1 cm on 61/17. Patent portal, splenic, hepatic veins. No retroperitoneal or retrocrural adenopathy. Other:  No ascites. Musculoskeletal: No acute osseous abnormality. IMPRESSION: 1. Dominant right hepatic lobe mass has nonspecific imaging characteristics. Although hepatocellular carcinoma or cholangiocarcinoma could have this appearance, an atypical (precontrast T1 hyperintensity) appearance of a giant hemangioma could look similar. If not already performed, consider tissue sampling. 2. Apparent gastric wall thickening, at least partially felt to be due to underdistention. Correlate with symptoms of gastritis or gastric neoplasm. Consider endoscopy. 3.  Aortic Atherosclerosis (ICD10-I70.0). 4. 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 Recommend follow-up ultrasound every 3 years. This recommendation follows ACR consensus guidelines: White Paper of the ACR Incidental Findings Committee II on Vascular Findings. J Am Coll Radiol 2013; 10:789-794. Electronically Signed   By: Abigail Miyamoto M.D.   On: 09/19/2021 15:21    ? 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  Superimposed on chronic fatty infiltration of  the liver. ?- PET scan (07/25/2021): Mild avidity in the 10.5 cm right lobe liver lesion.  No evidence of regional or distant metastatic disease. ?- Brain MRI (07/26/2021): No intracranial metastatic disease. ?- Biop

## 2021-09-21 NOTE — Progress Notes (Signed)
The proposed treatment discussed in conference is for discussion purpose only and is not a binding recommendation.  The patients have not been physically examined, or presented with their treatment options.  Therefore, final treatment plans cannot be decided.  

## 2021-09-21 NOTE — Patient Instructions (Signed)
[place] at [facility] ?Discharge Instructions ? ? ?You were seen and examined today by Dr. Delton Coombes. ? ?He discussed the results of your MRI. He also discussed treatment options for the cancer on your liver - surgical removal or injecting chemotherapy into the tumor.  We will make a referral to the surgeon for you.  We will also refer you for genetic testing.  ? ?Return as scheduled 1 month after surgery.  ? ? ?Thank you for choosing [place] at [facility] to provide your oncology and hematology care.  To afford each patient quality time with our provider, please arrive at least 15 minutes before your scheduled appointment time.  ? ?If you have a lab appointment with the [place] please come in thru the Main Entrance and check in at the main information desk. ? ?You need to re-schedule your appointment should you arrive 10 or more minutes late.  We strive to give you quality time with our providers, and arriving late affects you and other patients whose appointments are after yours.  Also, if you no show three or more times for appointments you may be dismissed from the clinic at the providers discretion.     ?Again, thank you for choosing [facility].  Our hope is that these requests will decrease the amount of time that you wait before being seen by our physicians.       ?_____________________________________________________________ ? ?Should you have questions after your visit to [facility], please contact our office at [phone] and follow the prompts.  Our office hours are 8:00 a.m. and 4:30 p.m. Monday - Friday.  Please note that voicemails left after 4:00 p.m. may not be returned until the following business day.  We are closed weekends and major holidays.  You do have access to a nurse 24-7, just call the main number to the clinic [phone] and do not press any options, hold on the line and a nurse will answer the  phone.   ? ?For prescription refill requests, have your pharmacy contact our office and allow 72 hours.   ? ?Due to Covid, you will need to wear a mask upon entering the hospital. If you do not have a mask, a mask will be given to you at the Main Entrance upon arrival. For doctor visits, patients may have 1 support person age 18 or older with them. For treatment visits, patients can not have anyone with them due to social distancing guidelines and our immunocompromised population.  ? ?   ?

## 2021-09-22 ENCOUNTER — Encounter (HOSPITAL_COMMUNITY): Payer: Self-pay | Admitting: Lab

## 2021-09-22 NOTE — Progress Notes (Unsigned)
Referral sent to [facility] Dr Barry Dienes  records faxed on 09/22/21 ?

## 2021-10-12 ENCOUNTER — Other Ambulatory Visit: Payer: Self-pay | Admitting: General Surgery

## 2021-10-12 ENCOUNTER — Other Ambulatory Visit (HOSPITAL_COMMUNITY): Payer: Self-pay | Admitting: General Surgery

## 2021-10-12 ENCOUNTER — Other Ambulatory Visit: Payer: Self-pay | Admitting: Surgery

## 2021-10-12 ENCOUNTER — Other Ambulatory Visit: Payer: Self-pay

## 2021-10-12 DIAGNOSIS — C220 Liver cell carcinoma: Secondary | ICD-10-CM

## 2021-10-12 NOTE — Progress Notes (Signed)
The proposed treatment discussed in conference is for discussion purpose only and is not a binding recommendation.  The patients have not been physically examined, or presented with their treatment options.  Therefore, final treatment plans cannot be decided.  

## 2021-10-13 ENCOUNTER — Ambulatory Visit
Admission: RE | Admit: 2021-10-13 | Discharge: 2021-10-13 | Disposition: A | Discharge status: Home / Self Care | Payer: Non-veteran care | Source: Ambulatory Visit | Attending: Surgery | Admitting: Surgery

## 2021-10-13 DIAGNOSIS — C220 Liver cell carcinoma: Secondary | ICD-10-CM

## 2021-10-13 HISTORY — PX: IR RADIOLOGIST EVAL & MGMT: IMG5224

## 2021-10-13 NOTE — H&P (Signed)
Interventional Radiology - Telephone Visit    History of Present Illness  Edward Gilbert is a 76 y.o. male with a relevant history of HTN and prostate cancer, seen in telephone visit for portval vein embolization at the request of Dr. Michaelle Birks ([facility]).  We confirmed identity with 2 personal identifiers.   Patient had recent MRI on September 16, 2021 demonstrating 11 cm mass in segments 5 and 6 of the right hepatic lobe.  Per report, biopsy of the mass demonstrated hepatocellular carcinoma.  Patient was evaluated by Dr. Michaelle Birks for consideration of right hepatectomy.  He was found to have a borderline low FLR of 22%.  He is being seen today for candidacy for a portal vein embolization (PVE) with goals of left hepatic lobe hypertrophy.     Past medical and surgical history reviewed. No interval changes. No interval hospitalizations.   Medications  I have reviewed the current medication list. Refer to chart for details. Current Outpatient Medications  Medication Instructions   amLODipine (NORVASC) 5 mg, Oral, Daily   ASPIRIN 81 PO Oral   lisinopril (ZESTRIL) 20 MG tablet 1 tablet, Oral, Daily   omeprazole (PRILOSEC) 20 MG capsule 1 capsule, Oral   TAMSULOSIN HCL PO 1 tablet, Oral, Daily   Vitamin D3 400 Units, Oral, Daily       Pertinent Lab Results    Latest Ref Rng & Units 09/07/2021    9:46 AM 07/12/2010   10:38 AM  CBC  WBC 4.0 - 10.5 K/uL 6.0   7.0    Hemoglobin 13.0 - 17.0 g/dL 14.3   16.3    Hematocrit 39.0 - 52.0 % 41.0   46.4    Platelets 150 - 400 K/uL 196   178        Latest Ref Rng & Units 09/07/2021    9:46 AM 07/12/2010   10:38 AM  CMP  Glucose 70 - 99 mg/dL 101   174    BUN 8 - 23 mg/dL 17   22    Creatinine 0.61 - 1.24 mg/dL 0.80   1.36    Sodium 135 - 145 mmol/L 140   135    Potassium 3.5 - 5.1 mmol/L 3.5   4.4    Chloride 98 - 111 mmol/L 105   99    CO2 22 - 32 mmol/L 28   28    Calcium 8.9 - 10.3 mg/dL 8.9   9.8    Total Protein 6.5 -  8.1 g/dL 7.0     Total Bilirubin 0.3 - 1.2 mg/dL 0.6     Alkaline Phos 38 - 126 U/L 66     AST 15 - 41 U/L 25     ALT 0 - 44 U/L 19       MELD-Na score: 6 at 09/07/2021  9:46 AM MELD score: 6 at 09/07/2021  9:46 AM Calculated from: Serum Creatinine: 0.80 mg/dL (Using min of 1 mg/dL) at 09/07/2021  9:46 AM Serum Sodium: 140 mmol/L (Using max of 137 mmol/L) at 09/07/2021  9:46 AM Total Bilirubin: 0.6 mg/dL (Using min of 1 mg/dL) at 09/07/2021  9:46 AM INR(ratio): 1.0 at 09/07/2021  9:46 AM Age: 41 years    Relevant and/or Recent Imaging: MRI 09/16/2021 reviewed  Long right portal vein main branch, suitable for PVE    Assessment & Plan Edward Gilbert is a 76 y.o. male with new large [place] mass in the right hepatic lobe seen today for portal vein embolization (  PVE) evaluation prior to possible surgical right hepatectomy.   Based on anatomy and underlying liver health (MELD 6), I think he is a good candidate for portal vein embolization. I explained the details of the procedure, including percutaneous transhepatic access of the portal vein and injection of glue embolic agent.  I explained potential unique risks to the procedure, including non target embolization of the glue and liver infarction or failure, which are rare.  The procedure would be expected to result in greater than 50% hypertrophy of the left hepatic lobe after 30 days.     Plan:  1. Portal vein embolization of the right portal vein    Pre-procedure planning:  Post-procedure disposition: [facility] with Dr. Denna Haggard and Dr. Serafina Royals, plan for overnight admission  Sedation plan: fentanyl and midazolam Medication holds: Aspirin x5 days   Positioning/access site: Supine   Foley catheter needed: No Contrast premedication: No Labs needed on or before procedure day: Yes (within 30 days)   Other: None     I spent a total of  60 Minutes in clinical consultation, greater than 50% of which was spent on medical decision-making and  counseling/coordinating care for right liver mass.   Visit type: Audio only (telephone). Audio (no video) only due to patient's lack of internet/smartphone capability. Alternative for in-person consultation at [facility], [address], [place], [place]. This visit type was conducted due to national recommendations for restrictions regarding the COVID-19 Pandemic (e.g. social distancing).  This format is felt to be most appropriate for this patient at this time.  All issues noted in this document were discussed and addressed.      Albin Felling, MD  Vascular and Interventional Radiology 10/13/2021 10:53 AM

## 2021-10-19 ENCOUNTER — Other Ambulatory Visit (HOSPITAL_COMMUNITY): Payer: Self-pay

## 2021-10-19 DIAGNOSIS — C220 Liver cell carcinoma: Secondary | ICD-10-CM

## 2021-10-19 NOTE — Progress Notes (Signed)
Order placed for genetic testing per Dr. Delton Coombes

## 2021-10-25 ENCOUNTER — Other Ambulatory Visit (HOSPITAL_COMMUNITY): Payer: Self-pay | Admitting: Interventional Radiology

## 2021-10-25 ENCOUNTER — Telehealth (HOSPITAL_COMMUNITY): Payer: Self-pay | Admitting: Radiology

## 2021-10-25 DIAGNOSIS — C220 Liver cell carcinoma: Secondary | ICD-10-CM

## 2021-10-25 NOTE — Telephone Encounter (Signed)
Called pt, spoke with wife. Pt will call me back tomorrow morning to schedule procedure with Dr. Denna Haggard. Velora Mediate

## 2021-10-26 ENCOUNTER — Telehealth (HOSPITAL_COMMUNITY): Payer: Self-pay | Admitting: Radiology

## 2021-10-26 NOTE — Telephone Encounter (Signed)
Pt returned my call today. He and I discussed that his PCP has decided that he should go to [facility] for his hepatic treatment. Pt's procedure with Dr. Denna Haggard was cancelled. JM

## 2021-10-27 ENCOUNTER — Telehealth (HOSPITAL_COMMUNITY): Payer: Non-veteran care | Admitting: Licensed Clinical Social Worker

## 2021-11-24 ENCOUNTER — Ambulatory Visit (HOSPITAL_COMMUNITY): Payer: Non-veteran care

## 2021-11-24 ENCOUNTER — Inpatient Hospital Stay: Admit: 2021-11-24 | Payer: Non-veteran care | Admitting: Surgery

## 2021-11-24 SURGERY — HEPATECTOMY, PARTIAL, OPEN
Anesthesia: General | Laterality: Right

## 2021-11-28 ENCOUNTER — Ambulatory Visit (HOSPITAL_COMMUNITY): Payer: Non-veteran care | Admitting: Hematology
